# Patient Record
Sex: Female | Born: 1965 | Race: White | Hispanic: No | Marital: Married
Health system: Southern US, Community
[De-identification: ages and names within clinical notes are randomized; demographics above are authoritative.]

---

## 2016-01-19 DIAGNOSIS — E039 Hypothyroidism, unspecified: Secondary | ICD-10-CM | POA: Insufficient documentation

## 2016-01-19 DIAGNOSIS — F419 Anxiety disorder, unspecified: Secondary | ICD-10-CM | POA: Insufficient documentation

## 2016-01-19 DIAGNOSIS — H7291 Unspecified perforation of tympanic membrane, right ear: Secondary | ICD-10-CM | POA: Insufficient documentation

## 2016-01-19 DIAGNOSIS — J4520 Mild intermittent asthma, uncomplicated: Secondary | ICD-10-CM | POA: Insufficient documentation

## 2016-03-26 DIAGNOSIS — M25561 Pain in right knee: Secondary | ICD-10-CM | POA: Insufficient documentation

## 2016-03-26 DIAGNOSIS — I10 Essential (primary) hypertension: Secondary | ICD-10-CM | POA: Insufficient documentation

## 2019-10-09 ENCOUNTER — Other Ambulatory Visit: Payer: Self-pay

## 2019-10-09 ENCOUNTER — Other Ambulatory Visit: Payer: Self-pay | Admitting: Sports Medicine

## 2019-10-09 ENCOUNTER — Ambulatory Visit: Payer: BC Managed Care – PPO | Admitting: Sports Medicine

## 2019-10-09 ENCOUNTER — Encounter: Payer: Self-pay | Admitting: Sports Medicine

## 2019-10-09 DIAGNOSIS — S93401A Sprain of unspecified ligament of right ankle, initial encounter: Secondary | ICD-10-CM | POA: Diagnosis not present

## 2019-10-09 DIAGNOSIS — M778 Other enthesopathies, not elsewhere classified: Secondary | ICD-10-CM | POA: Diagnosis not present

## 2019-10-09 DIAGNOSIS — M79671 Pain in right foot: Secondary | ICD-10-CM

## 2019-10-09 DIAGNOSIS — M25571 Pain in right ankle and joints of right foot: Secondary | ICD-10-CM | POA: Diagnosis not present

## 2019-10-09 MED ORDER — TRIAMCINOLONE ACETONIDE 40 MG/ML IJ SUSP
20.0000 mg | Freq: Once | INTRAMUSCULAR | Status: AC
  Administered 2019-10-09: 20 mg

## 2019-10-09 NOTE — Progress Notes (Signed)
Subjective:  Alisha Perkins is a 54 y.o. female patient who presents to office for evaluation of right ankle pain. Patient complains of continued pain in the ankle at the lateral side and to the foot that started after sprain injury in march, went to a walk-in clinic for orthopedics and the doctor x-rayed her there and told her that nothing was broken and that she had arthritis, patient reports the pain is still present flares up at times pain is 8-10 out of 10 sharp in nature over the right lateral foot and ankle.  Patient reports that there is also swelling. Patient has tried icing that helps a little bit and Voltaren gel with no relief in symptoms. Patient denies any other pedal complaints. Denies recent re-injury/trip/fall/sprain/any other causative factors.  Admits to a previous history many years ago of bilateral ankle fractures and is wondering if her history of fractures and weakness caused her to have problems.  Review of systems noncontributory.  Patient Active Problem List   Diagnosis Date Noted  . Essential hypertension 03/26/2016  . Pain in right knee 03/26/2016  . Morbid obesity with BMI of 40.0-44.9, adult (HCC) 01/19/2016  . Anxiety disorder 01/19/2016  . Hypothyroidism 01/19/2016  . Intrinsic asthma without status asthmaticus 01/19/2016  . Perforation of right tympanic membrane 01/19/2016    Current Outpatient Medications on File Prior to Visit  Medication Sig Dispense Refill  . citalopram (CELEXA) 20 MG tablet citalopram 20 mg tablet  TAKE 1 TABLET BY MOUTH DAILY    . levothyroxine (SYNTHROID) 175 MCG tablet Synthroid 175 mcg tablet  TAKE 1 TABLET BY MOUTH EVERY DAY ON EMPTY STOMACH.    . lidocaine (XYLOCAINE) 1 % (with preservative) injection Xylocaine 10 mg/mL (1 %) injection solution  Take 2 mL by injection route.    . meloxicam (MOBIC) 15 MG tablet Take 15 mg by mouth daily.     No current facility-administered medications on file prior to visit.    Not on  File  Objective:  General: Alert and oriented x3 in no acute distress  Dermatology: No open lesions bilateral lower extremities, no webspace macerations, no ecchymosis bilateral, all nails x 10 are well manicured.  Vascular: Dorsalis Pedis and Posterior Tibial pedal pulses palpable, Capillary Fill Time 3 seconds,(+) pedal hair growth bilateral, no edema bilateral lower extremities, Temperature gradient within normal limits.  Neurology: Michaell Cowing sensation intact via light touch bilateral.  Musculoskeletal: Mild tenderness with palpation at right lateral ankle over the lateral ankle ligaments pain is worsened over the sinus tarsi as well as over the extensor digitorum brevis muscle belly on the right foot, negative talar tilt, mild instability. No pain with calf compression bilateral. Range of motion within normal limits with mild guarding on right ankle pain is worsened with plantarflexion and inversion of the right foot. Strength within normal limits in all groups bilateral with mild guarding on right.  Gait: Antalgic gait  Assessment and Plan: Problem List Items Addressed This Visit    None    Visit Diagnoses    Right ankle pain, unspecified chronicity    -  Primary   Right foot pain       Sprain of right ankle, unspecified ligament, initial encounter       Sinus tarsi syndrome of right ankle       Capsulitis of right foot           -Complete examination performed -Xrays were down this visit we will x-ray her next visit and associate with  this encounter since our x-ray machine was down and we had already put the order in -Discussed treatement options for likely residual sprain versus capsulitis/sinus tarsi tendinitis -After oral consent and aseptic prep, injected a mixture containing 1 ml of 2%  plain lidocaine, 1 ml 0.5% plain marcaine, 0.5 ml of kenalog 10 and 0.5 ml of dexamethasone phosphate into the right lateral foot and ankle at the sinus tarsi without complication.  Post-injection care discussed with patient.  -Dispensed cam boot for patient to use as instructed -Continue with rest ice elevation and meloxicam as needed for pain and inflammation advised patient if there is additional breakthrough pain should take Tylenol -Dispensed Surgigrip compression sleeve to use for additional support and edema control -Patient to return to office 2 to 3 weeks or sooner if condition worsens.  Asencion Islam, DPM

## 2019-10-23 ENCOUNTER — Other Ambulatory Visit: Payer: Self-pay | Admitting: Sports Medicine

## 2019-10-23 ENCOUNTER — Ambulatory Visit (INDEPENDENT_AMBULATORY_CARE_PROVIDER_SITE_OTHER): Payer: BC Managed Care – PPO

## 2019-10-23 ENCOUNTER — Encounter: Payer: Self-pay | Admitting: Sports Medicine

## 2019-10-23 ENCOUNTER — Other Ambulatory Visit: Payer: Self-pay

## 2019-10-23 ENCOUNTER — Ambulatory Visit (INDEPENDENT_AMBULATORY_CARE_PROVIDER_SITE_OTHER): Payer: BC Managed Care – PPO | Admitting: Sports Medicine

## 2019-10-23 DIAGNOSIS — M25571 Pain in right ankle and joints of right foot: Secondary | ICD-10-CM

## 2019-10-23 DIAGNOSIS — S93401D Sprain of unspecified ligament of right ankle, subsequent encounter: Secondary | ICD-10-CM

## 2019-10-23 DIAGNOSIS — S86311S Strain of muscle(s) and tendon(s) of peroneal muscle group at lower leg level, right leg, sequela: Secondary | ICD-10-CM

## 2019-10-23 DIAGNOSIS — M778 Other enthesopathies, not elsewhere classified: Secondary | ICD-10-CM

## 2019-10-23 DIAGNOSIS — M79671 Pain in right foot: Secondary | ICD-10-CM

## 2019-10-23 NOTE — Patient Instructions (Signed)

## 2019-10-23 NOTE — Progress Notes (Signed)
Subjective:  Alisha Perkins is a 54 y.o. female patient who presents to office for follow up evaluation of right ankle pain. Patient reports that she has pain the pain is about the same no worse 5 out of 10 states that after the injection there is less swelling and with use of the compression sleeve that it is helping however she is still having pain and cannot tolerate wearing the boot because it rubs her leg especially the fibula bone.  Patient reports that she has been taking Tylenol as needed for pain and denies any other constitutional symptoms at this time.  Patient Active Problem List   Diagnosis Date Noted   Essential hypertension 03/26/2016   Pain in right knee 03/26/2016   Morbid obesity with BMI of 40.0-44.9, adult (HCC) 01/19/2016   Anxiety disorder 01/19/2016   Hypothyroidism 01/19/2016   Intrinsic asthma without status asthmaticus 01/19/2016   Perforation of right tympanic membrane 01/19/2016    Current Outpatient Medications on File Prior to Visit  Medication Sig Dispense Refill   citalopram (CELEXA) 20 MG tablet citalopram 20 mg tablet  TAKE 1 TABLET BY MOUTH DAILY     levothyroxine (SYNTHROID) 175 MCG tablet Synthroid 175 mcg tablet  TAKE 1 TABLET BY MOUTH EVERY DAY ON EMPTY STOMACH.     lidocaine (XYLOCAINE) 1 % (with preservative) injection Xylocaine 10 mg/mL (1 %) injection solution  Take 2 mL by injection route.     meloxicam (MOBIC) 15 MG tablet Take 15 mg by mouth daily.     No current facility-administered medications on file prior to visit.    Not on File  Objective:  General: Alert and oriented x3 in no acute distress  Dermatology: No open lesions bilateral lower extremities, no webspace macerations, no ecchymosis bilateral, all nails x 10 are well manicured.  Vascular: Dorsalis Pedis and Posterior Tibial pedal pulses palpable, Capillary Fill Time 3 seconds,(+) pedal hair growth bilateral, no edema bilateral lower extremities, Temperature gradient  within normal limits.  Neurology: Michaell Cowing sensation intact via light touch bilateral.  Musculoskeletal: Mild to moderate tenderness with palpation at right lateral ankle over the lateral ankle ligaments pain is worsened over the peroneal tendon course as well as sinus tarsi as well as over the extensor digitorum brevis muscle belly on the right foot, negative talar tilt, mild instability. No pain with calf compression bilateral. Range of motion within normal limits with mild guarding on right ankle pain is worsened with plantarflexion and inversion of the right foot similar to last visit. Strength within normal limits in all groups bilateral with mild guarding on right.  X-rays show mild degenerative changes at ankle, no fracture or dislocation, no other acute findings.  Assessment and Plan: Problem List Items Addressed This Visit    None    Visit Diagnoses    Tear of peroneal tendon, right, sequela    -  Primary   Relevant Orders   MR ANKLE RIGHT WO CONTRAST   Right ankle pain, unspecified chronicity       Right foot pain       Sprain of right ankle, unspecified ligament, subsequent encounter       Sinus tarsi syndrome of right ankle       Capsulitis of right foot           -Complete examination performed -X-rays reviewed which show no acute pathology to support patient symptoms likely there is a high concern for ligamentous or tendon tear since patient symptoms have been going on  since March without any relief patient is unable to tolerate immobilization in cam boot and did not make any improvement with steroid injection rest ice elevation and previous treatment performed by walk-in clinic and orthopedics so at this time we will proceed with ordering the MRI right ankle to rule out tear -Advised patient that she can discontinue cam boot since it is rubbing and causing increase in pain -Dispensed Tri-Lock for patient to use for additional stabilization to see if this will offer her some  relief and to protect tendon and ligament until we get MRI to evaluate further for tear since she cannot tolerate cam boot -Advised good supportive shoes with brace; shoe list provided -Continue with Tylenol as needed for pain -Patient to return to office after MRI or sooner if condition worsens.  Asencion Islam, DPM

## 2019-10-30 ENCOUNTER — Telehealth: Payer: Self-pay

## 2019-10-30 ENCOUNTER — Other Ambulatory Visit: Payer: Self-pay

## 2019-10-30 ENCOUNTER — Other Ambulatory Visit: Payer: Self-pay | Admitting: Sports Medicine

## 2019-10-30 DIAGNOSIS — S86311S Strain of muscle(s) and tendon(s) of peroneal muscle group at lower leg level, right leg, sequela: Secondary | ICD-10-CM

## 2019-10-30 NOTE — Telephone Encounter (Signed)
-----   Message from Asencion Islam, [place] sent at 10/23/2019  9:31 PM EDT ----- Regarding: MRI right ankle I put an order for MRI of the right ankle to rule out tear of peroneal tendon and sent order to [facility].  Please follow-up this order to make sure they received it.  I think that [facility] imaging does a lot of the precerts for MRIs so we may not have to do anything else. -Dr. Kathie Rhodes

## 2019-10-30 NOTE — Telephone Encounter (Signed)
Faxed to [place] imagining order for authorization of MRI of Rt ankle to rule out tear of peroneal tendon.   I faxed pt's demographics, copy of insurance card, detailed office notes, ordering Dr's (NPI, tax Id, address, phone number and fax number), any diagnostic studies with results or therapy, current signs and symptoms of exam, and meds

## 2019-11-05 ENCOUNTER — Ambulatory Visit
Admission: RE | Admit: 2019-11-05 | Discharge: 2019-11-05 | Disposition: A | Discharge status: Home / Self Care | Payer: BC Managed Care – PPO | Source: Ambulatory Visit | Attending: Sports Medicine | Admitting: Sports Medicine

## 2019-11-05 DIAGNOSIS — S86311S Strain of muscle(s) and tendon(s) of peroneal muscle group at lower leg level, right leg, sequela: Secondary | ICD-10-CM

## 2019-11-07 ENCOUNTER — Other Ambulatory Visit: Payer: BC Managed Care – PPO

## 2019-11-09 ENCOUNTER — Other Ambulatory Visit: Payer: Self-pay | Admitting: Sports Medicine

## 2019-11-09 DIAGNOSIS — S86311S Strain of muscle(s) and tendon(s) of peroneal muscle group at lower leg level, right leg, sequela: Secondary | ICD-10-CM

## 2019-11-11 ENCOUNTER — Encounter: Payer: Self-pay | Admitting: Sports Medicine

## 2019-11-11 ENCOUNTER — Other Ambulatory Visit: Payer: BC Managed Care – PPO

## 2019-11-11 ENCOUNTER — Other Ambulatory Visit: Payer: Self-pay

## 2019-11-11 ENCOUNTER — Ambulatory Visit: Payer: BC Managed Care – PPO | Admitting: Sports Medicine

## 2019-11-11 DIAGNOSIS — S93401D Sprain of unspecified ligament of right ankle, subsequent encounter: Secondary | ICD-10-CM

## 2019-11-11 DIAGNOSIS — M25571 Pain in right ankle and joints of right foot: Secondary | ICD-10-CM | POA: Diagnosis not present

## 2019-11-11 DIAGNOSIS — S86311S Strain of muscle(s) and tendon(s) of peroneal muscle group at lower leg level, right leg, sequela: Secondary | ICD-10-CM

## 2019-11-11 DIAGNOSIS — M25371 Other instability, right ankle: Secondary | ICD-10-CM

## 2019-11-11 DIAGNOSIS — S92021K Displaced fracture of anterior process of right calcaneus, subsequent encounter for fracture with nonunion: Secondary | ICD-10-CM

## 2019-11-11 NOTE — Progress Notes (Signed)
Subjective: Quanesha Klimaszewski is a 54 y.o. female patient who returns office for follow-up evaluation of right ankle pain and for discussion of MRI results.  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  Patient denies any new problems since last encounter.  Patient Active Problem List   Diagnosis Date Noted  . Essential hypertension 03/26/2016  . Pain in right knee 03/26/2016  . Morbid obesity with BMI of 40.0-44.9, adult (HCC) 01/19/2016  . Anxiety disorder 01/19/2016  . Hypothyroidism 01/19/2016  . Intrinsic asthma without status asthmaticus 01/19/2016  . Perforation of right tympanic membrane 01/19/2016    Current Outpatient Medications on File Prior to Visit  Medication Sig Dispense Refill  . citalopram (CELEXA) 20 MG tablet citalopram 20 mg tablet  TAKE 1 TABLET BY MOUTH DAILY    . levothyroxine (SYNTHROID) 175 MCG tablet Synthroid 175 mcg tablet  TAKE 1 TABLET BY MOUTH EVERY DAY ON EMPTY STOMACH.    . lidocaine (XYLOCAINE) 1 % (with preservative) injection Xylocaine 10 mg/mL (1 %) injection solution  Take 2 mL by injection route.    . meloxicam (MOBIC) 15 MG tablet Take 15 mg by mouth daily.     No current facility-administered medications on file prior to visit.    Not on File  Objective:  General: Alert and oriented x3 in no acute distress  Dermatology: No open lesions bilateral lower extremities, no webspace macerations, no ecchymosis bilateral, all nails x 10 are well manicured.  Vascular: Dorsalis Pedis and Posterior Tibial pedal pulses palpable, Capillary Fill Time 3 seconds,(+) pedal hair growth bilateral, no edema bilateral lower extremities, Temperature gradient within normal limits.  Neurology: Michaell Cowing sensation intact via light touch  bilateral.  Musculoskeletal: Mild to moderate tenderness with palpation at right lateral ankle over the lateral ankle ligaments pain over the peroneal tendon course as well as sinus tarsi as well as over the extensor digitorum brevis muscle belly on the right foot that appears to be improving as compared to last encounter, negative talar tilt, mild instability. No pain with calf compression bilateral. Range of motion within normal limits with mild guarding on right ankle pain is worsened with plantarflexion and inversion of the right foot somewhat improved since last encounter.. Strength within normal limits in all groups bilateral with mild guarding on right.  MRI suggestive of partial tear of peroneal tendon as well as a nonunited fracture of the anterior process of the calcaneus on the right ankle/foot Assessment and Plan: Problem List Items Addressed This Visit    None    Visit Diagnoses    Tear of peroneal tendon, right, sequela    -  Primary   Right ankle pain, unspecified chronicity       Displaced fracture of anterior process of right calcaneus, subsequent encounter for fracture with nonunion       Sprain of right ankle, unspecified ligament, subsequent encounter       Ankle instability, right          -Complete examination performed -MRI results reviewed -Discussed with patient treatment options for partial peroneal tear and fracture fragment at the anterior process of the calcaneus -Patient wants to think about surgical options of a tendon repair and removal of the fracture fragment I  provided patient a copy of her MRI report as well as a copy of her authorization form for her to review patient will bring this form back with her next visit for [place] to further discuss the surgery and plan a time to do this; patient desires to talk this over with her husband and to 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  Asencion Islam, DPM

## 2019-12-11 ENCOUNTER — Other Ambulatory Visit: Payer: Self-pay

## 2019-12-11 ENCOUNTER — Ambulatory Visit (INDEPENDENT_AMBULATORY_CARE_PROVIDER_SITE_OTHER): Payer: BC Managed Care – PPO | Admitting: Sports Medicine

## 2019-12-11 ENCOUNTER — Encounter: Payer: Self-pay | Admitting: Sports Medicine

## 2019-12-11 DIAGNOSIS — S92021K Displaced fracture of anterior process of right calcaneus, subsequent encounter for fracture with nonunion: Secondary | ICD-10-CM | POA: Diagnosis not present

## 2019-12-11 DIAGNOSIS — S86311S Strain of muscle(s) and tendon(s) of peroneal muscle group at lower leg level, right leg, sequela: Secondary | ICD-10-CM

## 2019-12-11 DIAGNOSIS — M25571 Pain in right ankle and joints of right foot: Secondary | ICD-10-CM

## 2019-12-11 DIAGNOSIS — M79671 Pain in right foot: Secondary | ICD-10-CM | POA: Diagnosis not present

## 2019-12-11 NOTE — Progress Notes (Signed)
Subjective: Alisha Perkins is a 54 y.o. female patient who returns office for follow-up evaluation of right ankle pain and for discussion of surgery.  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  Patient reports that her ankle brace has helped and would like to buy a new one.  Patient denies any other issues at this time.  Patient Active Problem List   Diagnosis Date Noted  . Essential hypertension 03/26/2016  . Pain in right knee 03/26/2016  . Morbid obesity with BMI of 40.0-44.9, adult (HCC) 01/19/2016  . Anxiety disorder 01/19/2016  . Hypothyroidism 01/19/2016  . Intrinsic asthma without status asthmaticus 01/19/2016  . Perforation of right tympanic membrane 01/19/2016    Current Outpatient Medications on File Prior to Visit  Medication Sig Dispense Refill  . citalopram (CELEXA) 20 MG tablet citalopram 20 mg tablet  TAKE 1 TABLET BY MOUTH DAILY    . levothyroxine (SYNTHROID) 175 MCG tablet Synthroid 175 mcg tablet  TAKE 1 TABLET BY MOUTH EVERY DAY ON EMPTY STOMACH.    . lidocaine (XYLOCAINE) 1 % (with preservative) injection Xylocaine 10 mg/mL (1 %) injection solution  Take 2 mL by injection route.    . meloxicam (MOBIC) 15 MG tablet Take 15 mg by mouth daily.     No current facility-administered medications on file prior to visit.    Not on File  History reviewed. No pertinent family history.   Social History   Socioeconomic History  . Marital status: Married    Spouse name: Not on file  . Number of children: Not on file  . Years of education: Not on file  . Highest education level: Not on file  Occupational History  . Not on file  Tobacco Use  . Smoking status: Not on file  Substance and Sexual Activity  . Alcohol use: Not on file  . Drug use: Not on file  . Sexual activity: Not on file  Other Topics Concern  . Not on file   Social History Narrative  . Not on file   Social Determinants of Health   Financial Resource Strain:   . Difficulty of Paying Living Expenses: Not on file  Food Insecurity:   . Worried About Programme researcher, broadcasting/film/video in the Last Year: Not on file  . Ran Out of Food in the Last Year: Not on file  Transportation Needs:   . Lack of Transportation (Medical): Not on file  . Lack of Transportation (Non-Medical): Not on file  Physical Activity:   . Days of Exercise per Week: Not on file  . Minutes of Exercise per Session: Not on file  Stress:   . Feeling of Stress : Not on file  Social Connections:   . Frequency of Communication with Friends and Family: Not on file  . Frequency of Social Gatherings with Friends and Family: Not on file  . Attends Religious Services: Not on file  . Active Member of Clubs or Organizations: Not on file  . Attends Banker Meetings: Not on file  . Marital Status: Not on file    History reviewed. No pertinent surgical history.   Objective:  General: Alert and oriented x3 in no acute distress  Dermatology: No open lesions bilateral lower extremities, no webspace macerations, no ecchymosis bilateral, all nails x 10 are well manicured.  Vascular: Dorsalis Pedis  and Posterior Tibial pedal pulses palpable, Capillary Fill Time 3 seconds,(+) pedal hair growth bilateral, no edema bilateral lower extremities, Temperature gradient within normal limits.  Neurology: Michaell Cowing sensation intact via light touch bilateral.  Musculoskeletal: Mild to moderate tenderness with palpation at right lateral ankle over the lateral ankle ligaments pain over the peroneal tendon course as well as sinus tarsi as well as over the extensor digitorum brevis muscle belly on the right foot that appears to be improving, negative talar tilt, mild instability. No pain with calf compression bilateral. Range of motion within normal limits with mild guarding on right ankle pain is worsened  with plantarflexion and inversion of the right foot, improving in nature.  Strength within normal limits in all groups bilateral with mild guarding on right.  Assessment and Plan: Problem List Items Addressed This Visit    None    Visit Diagnoses    Tear of peroneal tendon, right, sequela    -  Primary   Displaced fracture of anterior process of right calcaneus, subsequent encounter for fracture with nonunion       Right ankle pain, unspecified chronicity       Right foot pain          -Complete examination performed -Discussed again with patient treatment options for partial peroneal tear and fracture fragment at the anterior process of the calcaneus -Patient opt for surgical management. Consent obtained for excision of fracture fragment at right anterior calcaneus as well as repair right ankle.  Pre and Post op course explained. Risks, benefits, alternatives explained. No guarantees given or implied. Surgical booking slip submitted and provided patient with Surgical packet and info for [facility] specialty surgical center. -Patient will borrow knee scooter from friend to remain nonweightbearing for 1 month after surgery -Meanwhile continue with Tri-Lock for patient to use for additional stabilization until time for surgery -Advised may continue with Tylenol as needed for pain like previous -Patient to return to office for after surgery or sooner if condition worsens.  Asencion Islam, DPM

## 2019-12-25 ENCOUNTER — Ambulatory Visit: Payer: BC Managed Care – PPO | Admitting: Sports Medicine

## 2020-02-01 ENCOUNTER — Telehealth: Payer: Self-pay

## 2020-02-01 NOTE — Telephone Encounter (Signed)
DOS 02/15/2020  REPAIR TENDON RT - 28086 REMOVAL OF FX FRAGMENTS RT - 28415  [employer] ST EFFECTIVE DATE - 03/13/2019  PLAN DEDUCTIBLE - $1500.00 W/ $286.38 REMAINING OUT OF POCKET - $5900.00 W/ $1771.16 REMAINING COPAY $0.00 COINSURANCE - 30% NO AUTH REQUIRED PER WEBSITE

## 2020-02-13 ENCOUNTER — Other Ambulatory Visit: Payer: Self-pay | Admitting: Sports Medicine

## 2020-02-13 DIAGNOSIS — Z9889 Other specified postprocedural states: Secondary | ICD-10-CM

## 2020-02-13 NOTE — Progress Notes (Signed)
Post op meds entered 

## 2020-02-15 ENCOUNTER — Encounter: Payer: Self-pay | Admitting: Sports Medicine

## 2020-02-15 DIAGNOSIS — S92021K Displaced fracture of anterior process of right calcaneus, subsequent encounter for fracture with nonunion: Secondary | ICD-10-CM

## 2020-02-15 DIAGNOSIS — S86311S Strain of muscle(s) and tendon(s) of peroneal muscle group at lower leg level, right leg, sequela: Secondary | ICD-10-CM

## 2020-02-15 MED ORDER — DOCUSATE SODIUM 100 MG PO CAPS
100.0000 mg | ORAL_CAPSULE | Freq: Two times a day (BID) | ORAL | 0 refills | Status: DC
Start: 2020-02-15 — End: 2020-03-02

## 2020-02-15 MED ORDER — HYDROCODONE-ACETAMINOPHEN 10-325 MG PO TABS: 1.0000 | ORAL_TABLET | Freq: Four times a day (QID) | ORAL | 0 refills | Status: AC | PRN

## 2020-02-15 MED ORDER — PROMETHAZINE HCL 25 MG PO TABS: 25.0000 mg | ORAL_TABLET | Freq: Three times a day (TID) | ORAL | 0 refills | Status: DC | PRN

## 2020-02-15 MED ORDER — IBUPROFEN 800 MG PO TABS: 800.0000 mg | ORAL_TABLET | Freq: Three times a day (TID) | ORAL | 0 refills | Status: DC | PRN

## 2020-02-22 ENCOUNTER — Other Ambulatory Visit: Payer: Self-pay | Admitting: Sports Medicine

## 2020-02-22 NOTE — Telephone Encounter (Signed)
Please advise 

## 2020-02-23 ENCOUNTER — Ambulatory Visit (INDEPENDENT_AMBULATORY_CARE_PROVIDER_SITE_OTHER): Payer: BC Managed Care – PPO

## 2020-02-23 ENCOUNTER — Encounter: Payer: Self-pay | Admitting: Sports Medicine

## 2020-02-23 ENCOUNTER — Ambulatory Visit (INDEPENDENT_AMBULATORY_CARE_PROVIDER_SITE_OTHER): Payer: BC Managed Care – PPO | Admitting: Sports Medicine

## 2020-02-23 ENCOUNTER — Other Ambulatory Visit: Payer: Self-pay

## 2020-02-23 DIAGNOSIS — Z9889 Other specified postprocedural states: Secondary | ICD-10-CM

## 2020-02-23 DIAGNOSIS — S86311S Strain of muscle(s) and tendon(s) of peroneal muscle group at lower leg level, right leg, sequela: Secondary | ICD-10-CM | POA: Diagnosis not present

## 2020-02-23 DIAGNOSIS — S92021K Displaced fracture of anterior process of right calcaneus, subsequent encounter for fracture with nonunion: Secondary | ICD-10-CM

## 2020-02-23 DIAGNOSIS — M79671 Pain in right foot: Secondary | ICD-10-CM

## 2020-02-23 DIAGNOSIS — M25571 Pain in right ankle and joints of right foot: Secondary | ICD-10-CM

## 2020-02-23 MED ORDER — HYDROCODONE-ACETAMINOPHEN 10-325 MG PO TABS: 1.0000 | ORAL_TABLET | Freq: Four times a day (QID) | ORAL | 0 refills | Status: AC | PRN

## 2020-02-23 NOTE — Progress Notes (Signed)
Subjective: Alisha Perkins is a 54 y.o. female patient seen today in office for POV #1 (DOS 02/15/20), S/P excision of fracture fragment and repair of peroneal tendon on right. Patient admits some pain at surgical site feels a pulling sensation, denies calf pain, denies headache, chest pain, shortness of breath, nausea, vomiting, fever, or chills. No other issues noted.   Patient Active Problem List   Diagnosis Date Noted  . Essential hypertension 03/26/2016  . Pain in right knee 03/26/2016  . Morbid obesity with BMI of 40.0-44.9, adult (HCC) 01/19/2016  . Anxiety disorder 01/19/2016  . Hypothyroidism 01/19/2016  . Intrinsic asthma without status asthmaticus 01/19/2016  . Perforation of right tympanic membrane 01/19/2016    Current Outpatient Medications on File Prior to Visit  Medication Sig Dispense Refill  . cephALEXin (KEFLEX) 500 MG capsule SMARTSIG:4 Capsule(s) By Mouth Once    . citalopram (CELEXA) 20 MG tablet citalopram 20 mg tablet  TAKE 1 TABLET BY MOUTH DAILY    . docusate sodium (COLACE) 100 MG capsule Take 1 capsule (100 mg total) by mouth 2 (two) times daily. 10 capsule 0  . doxycycline (VIBRAMYCIN) 100 MG capsule Take 100 mg by mouth 2 (two) times daily.    Marland Kitchen ibuprofen (ADVIL) 800 MG tablet TAKE 1 TABLET(800 MG) BY MOUTH EVERY 8 HOURS AS NEEDED 30 tablet 0  . levothyroxine (SYNTHROID) 137 MCG tablet Take 137 mcg by mouth daily.    Marland Kitchen levothyroxine (SYNTHROID) 175 MCG tablet Synthroid 175 mcg tablet  TAKE 1 TABLET BY MOUTH EVERY DAY ON EMPTY STOMACH.    . lidocaine (XYLOCAINE) 1 % (with preservative) injection Xylocaine 10 mg/mL (1 %) injection solution  Take 2 mL by injection route.    . meloxicam (MOBIC) 15 MG tablet Take 15 mg by mouth daily.    . predniSONE (DELTASONE) 20 MG tablet Take 20 mg by mouth 2 (two) times daily.    . promethazine (PHENERGAN) 25 MG tablet Take 1 tablet (25 mg total) by mouth every 8 (eight) hours as needed for nausea or vomiting. 20 tablet 0  .  VIRTUSSIN A/C 100-10 MG/5ML syrup Take 10 mLs by mouth every 4 (four) hours as needed.     No current facility-administered medications on file prior to visit.    Not on File  Objective: There were no vitals filed for this visit.  General: No acute distress, AAOx3  Right foot: Sutures and staples intact with no gapping or dehiscence at surgical site, mild swelling to right foot, no erythema, no warmth, no drainage, no signs of infection noted, Capillary fill time <3 seconds in all digits, gross sensation present via light touch to right foot.Guarding with range of motion right foot.  No pain with calf compression.   Post Op Xray, Right foot: Fracture fragment excised at anterior calcaneus. Soft tissue swelling within normal limits for post op status.   Assessment and Plan:  Problem List Items Addressed This Visit   None   Visit Diagnoses    Tear of peroneal tendon, right, sequela    -  Primary   Relevant Orders   DG Foot Complete Right   S/P foot surgery, right       Displaced fracture of anterior process of right calcaneus, subsequent encounter for fracture with nonunion       Right ankle pain, unspecified chronicity       Right foot pain           -Patient seen and evaluated -Xrays reviewed consistent  with post op status  -Applied unna boot and dry sterile dressing to surgical site right foot secured with ACE wrap and stockinet  -Advised patient to make sure to keep dressings clean, dry, and intact to right surgical site, removing the ACE as needed  -Advised patient to continue with nonweightbearing and use of knee scooter ASA for DVT PPX -Advised patient to limit activity to necessity  -Advised patient to ice and elevate as instructed -Refilled Norco -Will plan for possible suture removal at next office visit. In the meantime, patient to call office if any issues or problems arise.   Asencion Islam, DPM

## 2020-03-01 ENCOUNTER — Telehealth: Payer: Self-pay | Admitting: *Deleted

## 2020-03-01 ENCOUNTER — Encounter: Payer: BC Managed Care – PPO | Admitting: Sports Medicine

## 2020-03-01 NOTE — Telephone Encounter (Signed)
-----   Message from Asencion Islam, [place] sent at 02/26/2020  5:04 PM EST ----- Regarding: Denial for pain medication Will you let her know that her pain medication was denied for coverage from the insurance. She can pay for the Rx from the pharmacy if she still need the Norco  -Dr. Marylene Land

## 2020-03-01 NOTE — Telephone Encounter (Signed)
Called and spoke with the patient about the pain medicine which was denied by the insurance and patient stated that she was doing better and does not need the pain medicine now. Alisha Perkins

## 2020-03-02 ENCOUNTER — Encounter: Payer: Self-pay | Admitting: Sports Medicine

## 2020-03-02 ENCOUNTER — Other Ambulatory Visit: Payer: Self-pay

## 2020-03-02 ENCOUNTER — Other Ambulatory Visit: Payer: Self-pay | Admitting: Sports Medicine

## 2020-03-02 ENCOUNTER — Ambulatory Visit (INDEPENDENT_AMBULATORY_CARE_PROVIDER_SITE_OTHER): Payer: BC Managed Care – PPO | Admitting: Sports Medicine

## 2020-03-02 DIAGNOSIS — Z9889 Other specified postprocedural states: Secondary | ICD-10-CM

## 2020-03-02 DIAGNOSIS — S86311S Strain of muscle(s) and tendon(s) of peroneal muscle group at lower leg level, right leg, sequela: Secondary | ICD-10-CM

## 2020-03-02 DIAGNOSIS — M25571 Pain in right ankle and joints of right foot: Secondary | ICD-10-CM

## 2020-03-02 DIAGNOSIS — M79671 Pain in right foot: Secondary | ICD-10-CM

## 2020-03-02 DIAGNOSIS — S92021K Displaced fracture of anterior process of right calcaneus, subsequent encounter for fracture with nonunion: Secondary | ICD-10-CM

## 2020-03-02 NOTE — Progress Notes (Signed)
Subjective: Alisha Perkins is a 54 y.o. female patient seen today in office for POV # 2 (DOS 02/15/20), S/P excision of fracture fragment and repair of peroneal tendon on right. Patient reports that she is doing better has some occasional pains currently taking ibuprofen, denies calf pain, denies headache, chest pain, shortness of breath, nausea, vomiting, fever, or chills. No other issues noted.   Patient Active Problem List   Diagnosis Date Noted  . Essential hypertension 03/26/2016  . Pain in right knee 03/26/2016  . Morbid obesity with BMI of 40.0-44.9, adult (HCC) 01/19/2016  . Anxiety disorder 01/19/2016  . Hypothyroidism 01/19/2016  . Intrinsic asthma without status asthmaticus 01/19/2016  . Perforation of right tympanic membrane 01/19/2016    Current Outpatient Medications on File Prior to Visit  Medication Sig Dispense Refill  . citalopram (CELEXA) 20 MG tablet Take 20 mg by mouth daily.    Marland Kitchen docusate sodium (COLACE) 100 MG capsule Take 100 mg by mouth 2 (two) times daily.    Marland Kitchen ibuprofen (ADVIL) 800 MG tablet Take 800 mg by mouth every 8 (eight) hours as needed.    Marland Kitchen levothyroxine (SYNTHROID) 137 MCG tablet Take 137 mcg by mouth daily before breakfast.    . meloxicam (MOBIC) 15 MG tablet Take 15 mg by mouth daily.    . predniSONE (DELTASONE) 20 MG tablet Take 20 mg by mouth daily with breakfast.    . promethazine (PHENERGAN) 25 MG tablet Take 25 mg by mouth every 6 (six) hours as needed for nausea or vomiting.     No current facility-administered medications on file prior to visit.    Not on File  Objective: There were no vitals filed for this visit.  General: No acute distress, AAOx3  Right foot: Sutures and staples intact with no gapping or dehiscence at surgical site, reduced swelling to right foot, no erythema, no warmth, no drainage, no signs of infection noted, Capillary fill time <3 seconds in all digits, gross sensation present via light touch to right foot.Guarding with  range of motion right foot.  No pain with calf compression.     Assessment and Plan:  Problem List Items Addressed This Visit   None   Visit Diagnoses    Tear of peroneal tendon, right, sequela    -  Primary   S/P foot surgery, right       Displaced fracture of anterior process of right calcaneus, subsequent encounter for fracture with nonunion       Right ankle pain, unspecified chronicity       Right foot pain           -Patient seen and evaluated -Sutures removed but staples were left intact -Applied antibiotic cream and dry sterile dressing to surgical site right foot secured with ACE wrap and stockinet  -Advised patient may have daughter to assist with changing dry dressings on next week -Advised patient to continue with nonweightbearing and use of knee scooter ASA for DVT PPX -Advised patient to limit activity to necessity  -Advised patient to ice and elevate as instructed -Continue with Motrin as needed for pain -Will plan for removal of staples, x-ray, and to allow weightbearing with cam boot at next office visit. In the meantime, patient to call office if any issues or problems arise.   Asencion Islam, DPM

## 2020-03-02 NOTE — Telephone Encounter (Signed)
Please advise 

## 2020-03-09 ENCOUNTER — Other Ambulatory Visit: Payer: Self-pay | Admitting: Sports Medicine

## 2020-03-15 ENCOUNTER — Encounter: Payer: BC Managed Care – PPO | Admitting: Sports Medicine

## 2020-03-16 ENCOUNTER — Encounter: Payer: Self-pay | Admitting: Sports Medicine

## 2020-03-16 ENCOUNTER — Other Ambulatory Visit: Payer: Self-pay

## 2020-03-16 ENCOUNTER — Ambulatory Visit (INDEPENDENT_AMBULATORY_CARE_PROVIDER_SITE_OTHER): Payer: BC Managed Care – PPO | Admitting: Sports Medicine

## 2020-03-16 ENCOUNTER — Ambulatory Visit (INDEPENDENT_AMBULATORY_CARE_PROVIDER_SITE_OTHER): Payer: Self-pay

## 2020-03-16 DIAGNOSIS — M25571 Pain in right ankle and joints of right foot: Secondary | ICD-10-CM

## 2020-03-16 DIAGNOSIS — S86311S Strain of muscle(s) and tendon(s) of peroneal muscle group at lower leg level, right leg, sequela: Secondary | ICD-10-CM

## 2020-03-16 DIAGNOSIS — Z9889 Other specified postprocedural states: Secondary | ICD-10-CM

## 2020-03-16 DIAGNOSIS — S92021K Displaced fracture of anterior process of right calcaneus, subsequent encounter for fracture with nonunion: Secondary | ICD-10-CM

## 2020-03-16 DIAGNOSIS — M79671 Pain in right foot: Secondary | ICD-10-CM

## 2020-03-16 NOTE — Progress Notes (Signed)
Subjective: Alisha Perkins is a 55 y.o. female patient seen today in office for POV # 3 (DOS 02/15/20), S/P excision of fracture fragment and repair of peroneal tendon on right. Patient reports that she is doing better has some pain and is taking ibuprofen, denies calf pain, denies headache, chest pain, shortness of breath, nausea, vomiting, fever, or chills. No other issues noted.   Patient Active Problem List   Diagnosis Date Noted  . Essential hypertension 03/26/2016  . Pain in right knee 03/26/2016  . Morbid obesity with BMI of 40.0-44.9, adult (HCC) 01/19/2016  . Anxiety disorder 01/19/2016  . Hypothyroidism 01/19/2016  . Intrinsic asthma without status asthmaticus 01/19/2016  . Perforation of right tympanic membrane 01/19/2016    Current Outpatient Medications on File Prior to Visit  Medication Sig Dispense Refill  . citalopram (CELEXA) 20 MG tablet Take 20 mg by mouth daily.    Marland Kitchen docusate sodium (COLACE) 100 MG capsule Take 100 mg by mouth 2 (two) times daily.    Marland Kitchen ibuprofen (ADVIL) 800 MG tablet Take 800 mg by mouth every 8 (eight) hours as needed.    Marland Kitchen ibuprofen (ADVIL) 800 MG tablet TAKE 1 TABLET(800 MG) BY MOUTH EVERY 8 HOURS AS NEEDED 30 tablet 0  . levothyroxine (SYNTHROID) 137 MCG tablet Take 137 mcg by mouth daily before breakfast.    . meloxicam (MOBIC) 15 MG tablet Take 15 mg by mouth daily.    . predniSONE (DELTASONE) 20 MG tablet Take 20 mg by mouth daily with breakfast.    . promethazine (PHENERGAN) 25 MG tablet Take 25 mg by mouth every 6 (six) hours as needed for nausea or vomiting.     No current facility-administered medications on file prior to visit.    Not on File  Objective: There were no vitals filed for this visit.  General: No acute distress, AAOx3  Right foot: Staples intact with no gapping or dehiscence at surgical site, reduced swelling to right foot, no erythema, no warmth, no drainage, no signs of infection noted, Capillary fill time <3 seconds in all  digits, gross sensation present via light touch to right foot. Mild guarding with range of motion right foot.  No pain with calf compression.    Xrays consistent with post op status  Assessment and Plan:  Problem List Items Addressed This Visit   None   Visit Diagnoses    Tear of peroneal tendon, right, sequela    -  Primary   Relevant Orders   DG Ankle Complete Right (Completed)   S/P foot surgery, right       Displaced fracture of anterior process of right calcaneus, subsequent encounter for fracture with nonunion       Right ankle pain, unspecified chronicity       Right foot pain           -Patient seen and evaluated -X-rays reviewed -Staples were removed -Applied antibiotic cream and dry sterile dressing to surgical site right foot secured with ACE wrap and stockinet  -Patient may slowly begin to weight-bear with cam boot as dispensed on today's visit advised patient may use scooter for long distance or walker or cane -Advised patient to ice and elevate as instructed -Continue with Motrin as needed for pain -Will plan for PT and slowly increasing activities at next office visit. In the meantime, patient to call office if any issues or problems arise.   Asencion Islam, DPM

## 2020-03-18 ENCOUNTER — Ambulatory Visit: Payer: Self-pay | Admitting: Sports Medicine

## 2020-03-18 ENCOUNTER — Telehealth: Payer: Self-pay | Admitting: *Deleted

## 2020-03-18 NOTE — Telephone Encounter (Signed)
Patient called and stated that the steri stripes were coming off on the back of the right heel from when Dr Marylene Land put them on when patient was in the office the other day (patient had surgery)  and I stated to the patient that Dr Marylene Land stated that the steri stripes would peel off slowly and that was normal and they are suppose to do that and I stated to the patient to take it easy and use the knee scooter and can put a little bit of ointment on it as well and patient stated that she got a call from our office Chad Cordial) saying that she was to come in and have it checked and I assured her that I spoke with Dr Marylene Land and that this  was normal and if any concerns or questions to call the [place] office. Misty Stanley

## 2020-04-08 ENCOUNTER — Encounter: Payer: Self-pay | Admitting: Sports Medicine

## 2020-04-08 ENCOUNTER — Other Ambulatory Visit: Payer: Self-pay

## 2020-04-08 ENCOUNTER — Ambulatory Visit (INDEPENDENT_AMBULATORY_CARE_PROVIDER_SITE_OTHER): Payer: BC Managed Care – PPO | Admitting: Sports Medicine

## 2020-04-08 DIAGNOSIS — Z9889 Other specified postprocedural states: Secondary | ICD-10-CM

## 2020-04-08 DIAGNOSIS — M79671 Pain in right foot: Secondary | ICD-10-CM

## 2020-04-08 DIAGNOSIS — M25571 Pain in right ankle and joints of right foot: Secondary | ICD-10-CM

## 2020-04-08 DIAGNOSIS — S86311S Strain of muscle(s) and tendon(s) of peroneal muscle group at lower leg level, right leg, sequela: Secondary | ICD-10-CM

## 2020-04-08 DIAGNOSIS — S92021K Displaced fracture of anterior process of right calcaneus, subsequent encounter for fracture with nonunion: Secondary | ICD-10-CM

## 2020-04-08 NOTE — Progress Notes (Signed)
Subjective: Alisha Perkins is a 55 y.o. female patient seen today in office for POV # 4 (DOS 02/15/20), S/P excision of fracture fragment and repair of peroneal tendon on right. Patient reports that she is doing good but did have some rubbing in the cam boot and use a pad to protect her ankle.  Patient reports that she also has some swelling but is not icing like she should be and using the Ace wrap which has helped a lot with the swelling.  Patient denies calf pain, denies headache, chest pain, shortness of breath, nausea, vomiting, fever, or chills. No other issues noted.   Patient Active Problem List   Diagnosis Date Noted  . Essential hypertension 03/26/2016  . Pain in right knee 03/26/2016  . Morbid obesity with BMI of 40.0-44.9, adult (HCC) 01/19/2016  . Anxiety disorder 01/19/2016  . Hypothyroidism 01/19/2016  . Intrinsic asthma without status asthmaticus 01/19/2016  . Perforation of right tympanic membrane 01/19/2016    Current Outpatient Medications on File Prior to Visit  Medication Sig Dispense Refill  . citalopram (CELEXA) 20 MG tablet Take 20 mg by mouth daily.    Marland Kitchen docusate sodium (COLACE) 100 MG capsule Take 100 mg by mouth 2 (two) times daily.    Marland Kitchen ibuprofen (ADVIL) 800 MG tablet Take 800 mg by mouth every 8 (eight) hours as needed.    Marland Kitchen ibuprofen (ADVIL) 800 MG tablet TAKE 1 TABLET(800 MG) BY MOUTH EVERY 8 HOURS AS NEEDED 30 tablet 0  . levothyroxine (SYNTHROID) 137 MCG tablet Take 137 mcg by mouth daily before breakfast.    . meloxicam (MOBIC) 15 MG tablet Take 15 mg by mouth daily.    . predniSONE (DELTASONE) 20 MG tablet Take 20 mg by mouth daily with breakfast.    . promethazine (PHENERGAN) 25 MG tablet Take 25 mg by mouth every 6 (six) hours as needed for nausea or vomiting.     No current facility-administered medications on file prior to visit.    Not on File  Objective: There were no vitals filed for this visit.  General: No acute distress, AAOx3  Right foot:  Incision well-healed right lateral foot and ankle, mild swelling, no erythema, no warmth, no drainage, no signs of infection noted, Capillary fill time <3 seconds in all digits, gross sensation present via light touch to right foot.  Range of motion within normal limits without major limitation.  No pain with calf compression.     Assessment and Plan:  Problem List Items Addressed This Visit   None   Visit Diagnoses    S/P foot surgery, right    -  Primary   Tear of peroneal tendon, right, sequela       Displaced fracture of anterior process of right calcaneus, subsequent encounter for fracture with nonunion       Right ankle pain, unspecified chronicity       Right foot pain           -Patient seen and evaluated -Incision well-healed -Dispensed surgical shoe for patient to weight-bear as instructed -Dispensed Surgigrip compression sleeve to assist with edema control -Advised patient next visit may consider transitioning her to tennis shoe with brace if she is doing well or adding on PT -Advised patient to ice and elevate as instructed -Continue with Motrin as needed for pain -Will plan for post op check at next office visit. In the meantime, patient to call office if any issues or problems arise.   Asencion Islam, DPM

## 2020-04-29 ENCOUNTER — Encounter: Payer: Self-pay | Admitting: Sports Medicine

## 2020-04-29 ENCOUNTER — Other Ambulatory Visit: Payer: Self-pay

## 2020-04-29 ENCOUNTER — Ambulatory Visit (INDEPENDENT_AMBULATORY_CARE_PROVIDER_SITE_OTHER): Payer: BC Managed Care – PPO | Admitting: Sports Medicine

## 2020-04-29 DIAGNOSIS — M79671 Pain in right foot: Secondary | ICD-10-CM

## 2020-04-29 DIAGNOSIS — M25571 Pain in right ankle and joints of right foot: Secondary | ICD-10-CM

## 2020-04-29 DIAGNOSIS — S86311S Strain of muscle(s) and tendon(s) of peroneal muscle group at lower leg level, right leg, sequela: Secondary | ICD-10-CM

## 2020-04-29 DIAGNOSIS — S92021K Displaced fracture of anterior process of right calcaneus, subsequent encounter for fracture with nonunion: Secondary | ICD-10-CM

## 2020-04-29 DIAGNOSIS — Z9889 Other specified postprocedural states: Secondary | ICD-10-CM

## 2020-04-29 NOTE — Patient Instructions (Signed)

## 2020-04-29 NOTE — Progress Notes (Signed)
Subjective: Alisha Perkins is a 55 y.o. female patient seen today in office for POV # 5 (DOS 02/15/20), S/P excision of fracture fragment and repair of peroneal tendon on right. Patient reports that she is doing ok with a little swelling.  Patient denies calf pain, denies headache, chest pain, shortness of breath, nausea, vomiting, fever, or chills. No other issues noted.   Patient Active Problem List   Diagnosis Date Noted  . Essential hypertension 03/26/2016  . Pain in right knee 03/26/2016  . Morbid obesity with BMI of 40.0-44.9, adult (HCC) 01/19/2016  . Anxiety disorder 01/19/2016  . Hypothyroidism 01/19/2016  . Intrinsic asthma without status asthmaticus 01/19/2016  . Perforation of right tympanic membrane 01/19/2016    Current Outpatient Medications on File Prior to Visit  Medication Sig Dispense Refill  . azithromycin (ZITHROMAX) 250 MG tablet Take by mouth.    . citalopram (CELEXA) 20 MG tablet Take 20 mg by mouth daily.    Marland Kitchen docusate sodium (COLACE) 100 MG capsule Take 100 mg by mouth 2 (two) times daily.    . fluconazole (DIFLUCAN) 150 MG tablet TAKE ONE TABLET BY MOUTH ONCE    . ibuprofen (ADVIL) 800 MG tablet Take 800 mg by mouth every 8 (eight) hours as needed.    Marland Kitchen ibuprofen (ADVIL) 800 MG tablet TAKE 1 TABLET(800 MG) BY MOUTH EVERY 8 HOURS AS NEEDED 30 tablet 0  . levothyroxine (SYNTHROID) 137 MCG tablet Take 137 mcg by mouth daily before breakfast.    . meloxicam (MOBIC) 15 MG tablet Take 15 mg by mouth daily.    . predniSONE (DELTASONE) 20 MG tablet Take 20 mg by mouth daily with breakfast.    . promethazine (PHENERGAN) 25 MG tablet Take 25 mg by mouth every 6 (six) hours as needed for nausea or vomiting.     No current facility-administered medications on file prior to visit.    No Known Allergies  Objective: There were no vitals filed for this visit.  General: No acute distress, AAOx3  Right foot: Incision well-healed right lateral foot and ankle, mild swelling, no  erythema, no warmth, no drainage, no signs of infection noted, Capillary fill time <3 seconds in all digits, gross sensation present via light touch to right foot.  Range of motion within normal limits without major limitation.  No pain with calf compression.   Assessment and Plan:  Problem List Items Addressed This Visit   None   Visit Diagnoses    S/P foot surgery, right    -  Primary   Tear of peroneal tendon, right, sequela       Displaced fracture of anterior process of right calcaneus, subsequent encounter for fracture with nonunion       Right ankle pain, unspecified chronicity       Right foot pain           -Patient seen and evaluated -May wean from surgical shoe to tennis shoe with brace -Dispensed Surgical compression sleeve to assist with edema control -Advised patient to continue to ice and elevate as instructed -Encouraged scar creams and gentle ROM -Continue with Motrin as needed for pain -Will plan for xrays and post op check at next office visit. In the meantime, patient to call office if any issues or problems arise. May add on PT if patient needs it.   Asencion Islam, DPM

## 2020-05-24 ENCOUNTER — Ambulatory Visit: Payer: BC Managed Care – PPO | Admitting: Sports Medicine

## 2020-05-24 ENCOUNTER — Encounter: Payer: Self-pay | Admitting: Sports Medicine

## 2020-05-24 ENCOUNTER — Other Ambulatory Visit: Payer: Self-pay

## 2020-05-24 ENCOUNTER — Ambulatory Visit (INDEPENDENT_AMBULATORY_CARE_PROVIDER_SITE_OTHER): Payer: BC Managed Care – PPO

## 2020-05-24 DIAGNOSIS — S92021K Displaced fracture of anterior process of right calcaneus, subsequent encounter for fracture with nonunion: Secondary | ICD-10-CM

## 2020-05-24 DIAGNOSIS — Z9889 Other specified postprocedural states: Secondary | ICD-10-CM

## 2020-05-24 DIAGNOSIS — M25571 Pain in right ankle and joints of right foot: Secondary | ICD-10-CM

## 2020-05-24 DIAGNOSIS — S86311S Strain of muscle(s) and tendon(s) of peroneal muscle group at lower leg level, right leg, sequela: Secondary | ICD-10-CM

## 2020-05-24 DIAGNOSIS — M79671 Pain in right foot: Secondary | ICD-10-CM

## 2020-05-24 NOTE — Progress Notes (Signed)
Subjective: Alisha Perkins is a 55 y.o. female patient seen today in office for POV # 6 (DOS 02/15/20), S/P excision of fracture fragment and repair of peroneal tendon on right. Patient reports that she is doing good, has no pain but incision still feels funny, Reports that she got new shoes and her feet feel good, no other issues noted.   Patient Active Problem List   Diagnosis Date Noted  . Essential hypertension 03/26/2016  . Pain in right knee 03/26/2016  . Morbid obesity with BMI of 40.0-44.9, adult (HCC) 01/19/2016  . Anxiety disorder 01/19/2016  . Hypothyroidism 01/19/2016  . Intrinsic asthma without status asthmaticus 01/19/2016  . Perforation of right tympanic membrane 01/19/2016    Current Outpatient Medications on File Prior to Visit  Medication Sig Dispense Refill  . azithromycin (ZITHROMAX) 250 MG tablet Take by mouth.    . citalopram (CELEXA) 20 MG tablet Take 20 mg by mouth daily.    Marland Kitchen docusate sodium (COLACE) 100 MG capsule Take 100 mg by mouth 2 (two) times daily.    . fluconazole (DIFLUCAN) 150 MG tablet TAKE ONE TABLET BY MOUTH ONCE    . ibuprofen (ADVIL) 800 MG tablet Take 800 mg by mouth every 8 (eight) hours as needed.    Marland Kitchen ibuprofen (ADVIL) 800 MG tablet TAKE 1 TABLET(800 MG) BY MOUTH EVERY 8 HOURS AS NEEDED 30 tablet 0  . levothyroxine (SYNTHROID) 137 MCG tablet Take 137 mcg by mouth daily before breakfast.    . meloxicam (MOBIC) 15 MG tablet Take 15 mg by mouth daily.    . predniSONE (DELTASONE) 20 MG tablet Take 20 mg by mouth daily with breakfast.    . promethazine (PHENERGAN) 25 MG tablet Take 25 mg by mouth every 6 (six) hours as needed for nausea or vomiting.     No current facility-administered medications on file prior to visit.    No Known Allergies  Objective: There were no vitals filed for this visit.  General: No acute distress, AAOx3  Right foot: Incision well-healed right lateral foot and ankle, mild swelling, no erythema, no warmth, no drainage, no  signs of infection noted, Capillary fill time <3 seconds in all digits, gross sensation present via light touch to right foot.  Range of motion within normal limits without major limitation.  Strength 5/5 in all groups. No pain with calf compression.   Xrays no acute osseous findings   Assessment and Plan:  Problem List Items Addressed This Visit   None   Visit Diagnoses    S/P foot surgery, right    -  Primary   Relevant Orders   DG Foot Complete Right (Completed)   Tear of peroneal tendon, right, sequela       Relevant Orders   DG Foot Complete Right (Completed)   Displaced fracture of anterior process of right calcaneus, subsequent encounter for fracture with nonunion       Relevant Orders   DG Foot Complete Right (Completed)   Right ankle pain, unspecified chronicity       Right foot pain           -Patient seen and evaluated -X-rays reviewed -Patient is well healed -May continue with good supportive tennis shoes and may discontinue ankle brace -Encouraged patient to continue with scar creams and gentle ROM and since she is doing well she does not need physical therapy at this time -Patient to return as needed patient is discharged from postoperative care return if fails to continue to  improve.  Asencion Islam, DPM

## 2021-07-27 IMAGING — MR MR ANKLE*R* W/O CM
5 series · 36 of 40 positions shown · non-contrast
Comparison: 08/06/2006

CLINICAL DATA: Right lateral ankle pain on off since [REDACTED]

EXAM:
MRI OF THE RIGHT ANKLE WITHOUT CONTRAST
TECHNIQUE: Multiplanar, multisequence MR imaging of the ankle was performed. No
intravenous contrast was administered.

[Series 4: T2 fat-sat · axial · 3.0mm · 0.50mm/px · z∈[-89,+31]mm · 9 of 32 slices shown (1 of 2)]
[im 1/32]
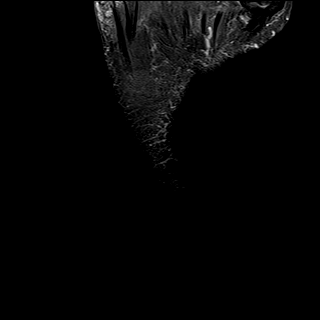
[im 4/32]
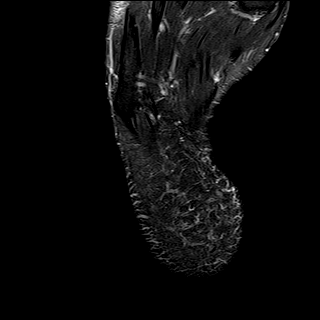
[im 8/32]
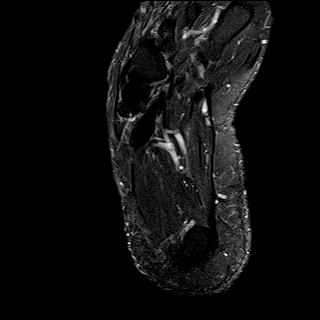
[im 12/32]
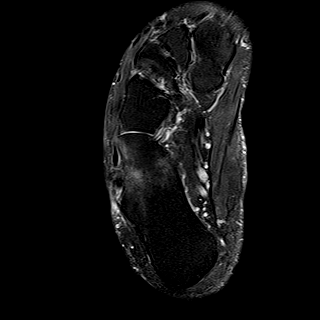
[im 16/32]
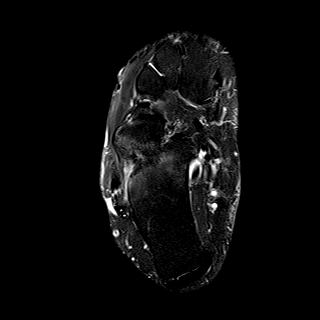
[im 20/32]
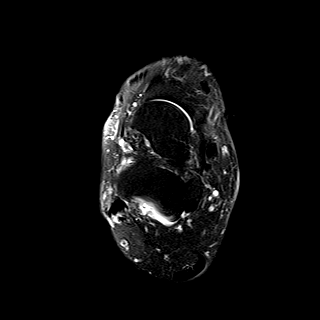
[im 24/32]
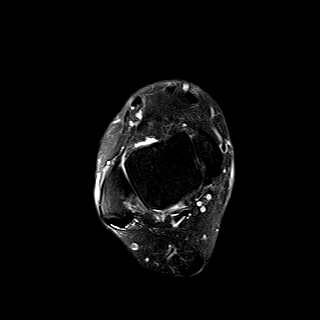
[im 28/32]
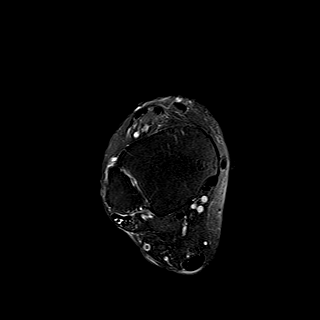
[im 32/32]
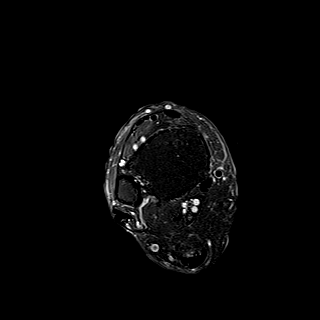

[Series 5: PD fat-sat · axial · 3.0mm · 0.42mm/px · z∈[-89,+31]mm · 9 of 32 slices shown]
[im 1/32]
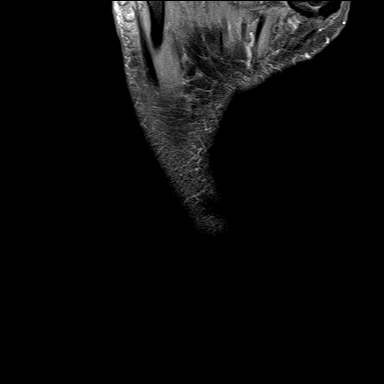
[im 4/32]
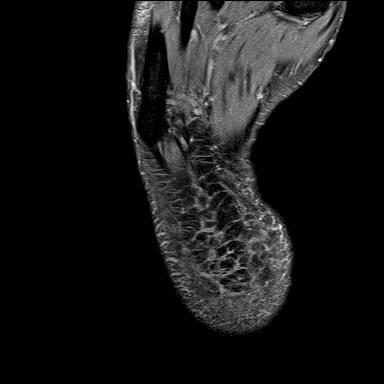
[im 8/32]
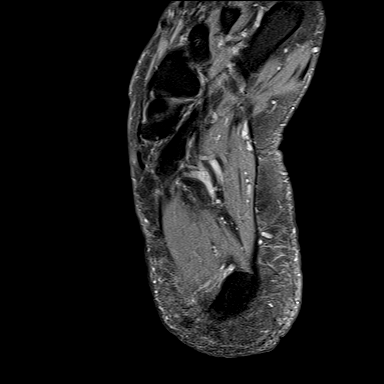
[im 12/32]
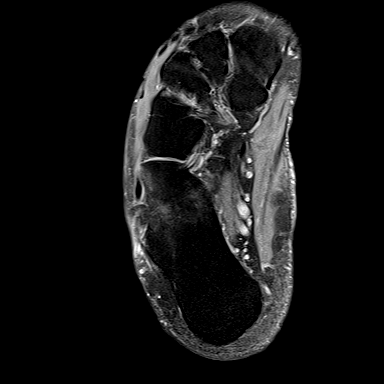
[im 16/32]
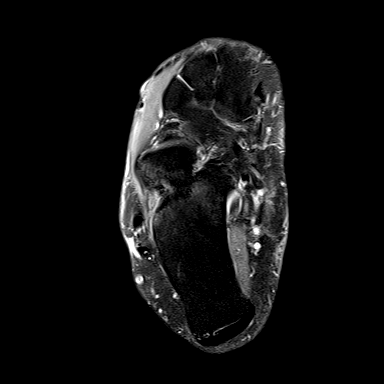
[im 20/32]
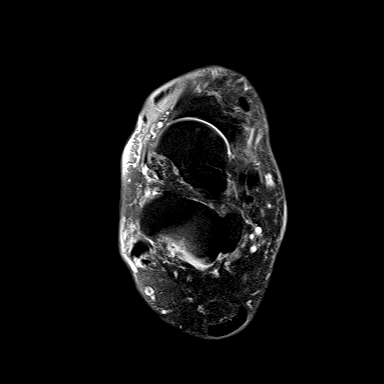
[im 24/32]
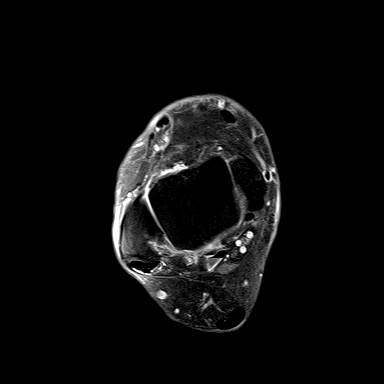
[im 28/32]
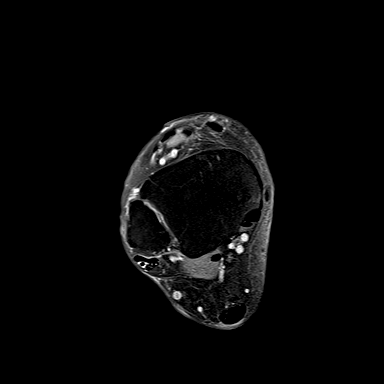
[im 32/32]
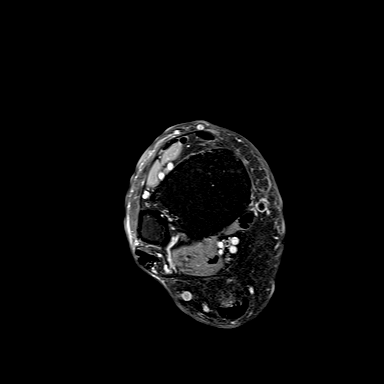

[Series 6: T1 · sagittal · 4.0mm · 0.56mm/px · 6 of 22 slices shown]
[im 1/22]
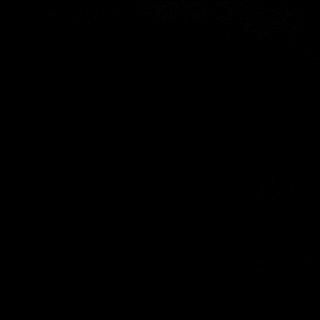
[im 5/22]
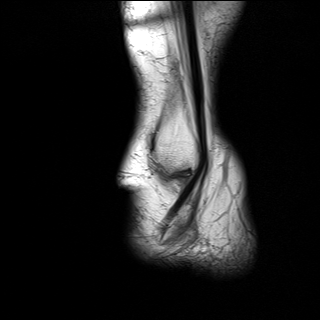
[im 9/22]
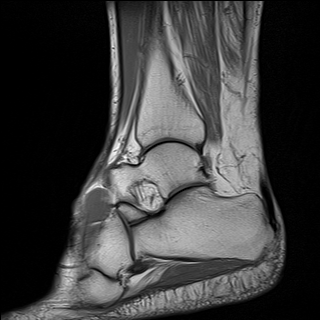
[im 13/22]
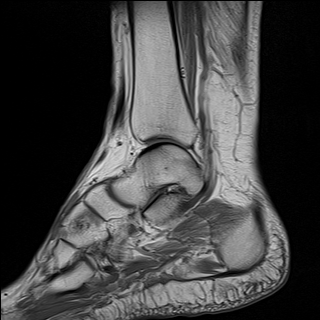
[im 17/22]
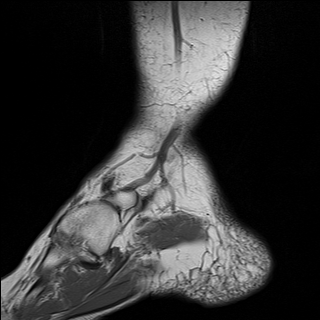
[im 22/22]
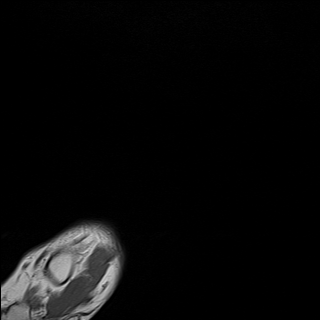

[Series 7: T2 fat-sat · coronal · 3.0mm · 0.50mm/px · 8 of 35 slices shown (2 of 2)]
[im 1/35]
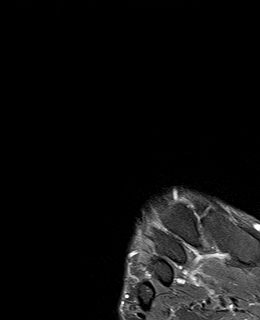
[im 4/35]
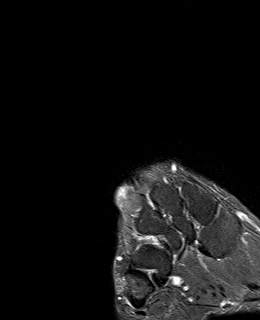
[im 12/35]
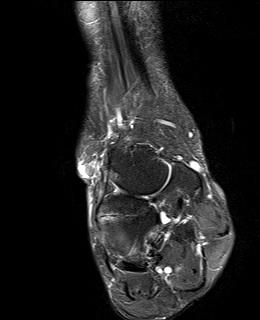
[im 16/35]
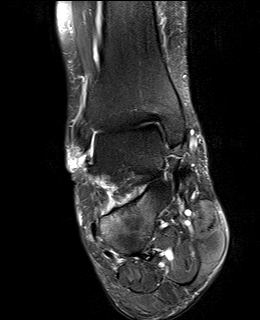
[im 19/35]
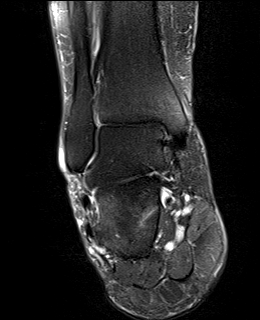
[im 23/35]
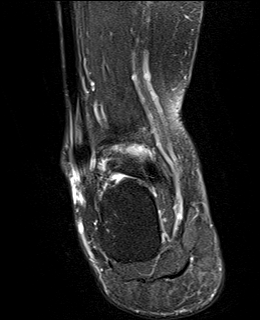
[im 31/35]
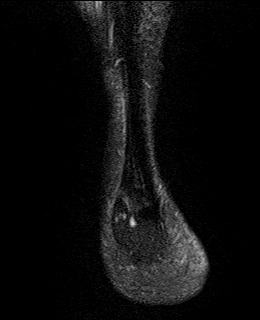
[im 35/35]
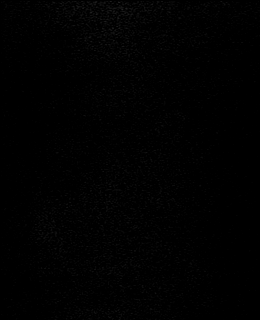

[Series 8: STIR · sagittal · 4.0mm · 0.35mm/px · 4 of 22 slices shown]
[im 1/22]
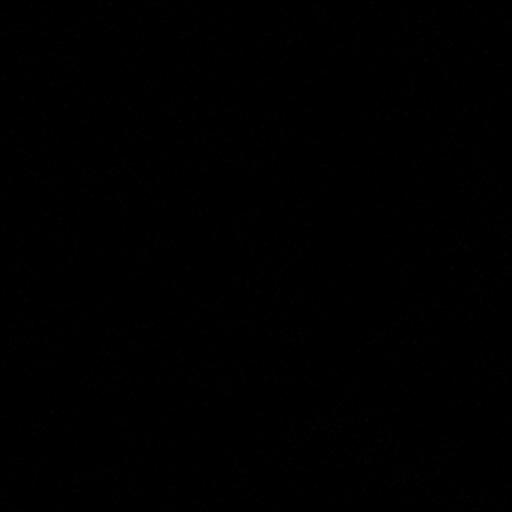
[im 5/22]
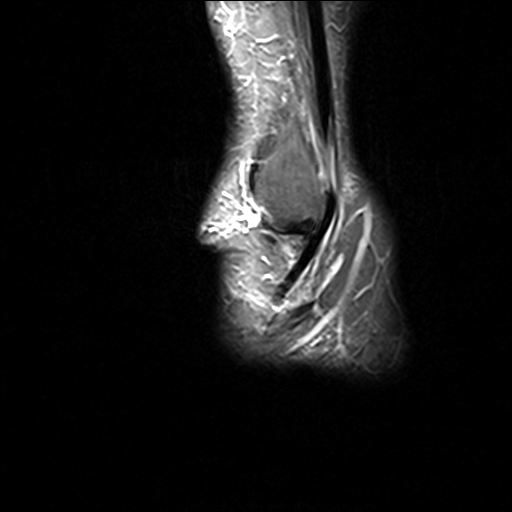
[im 9/22]
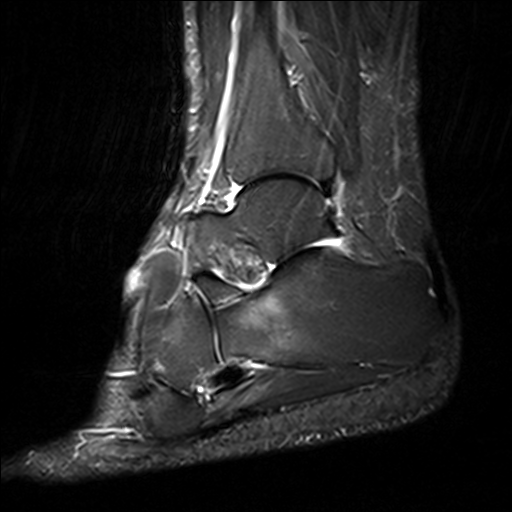
[im 13/22]
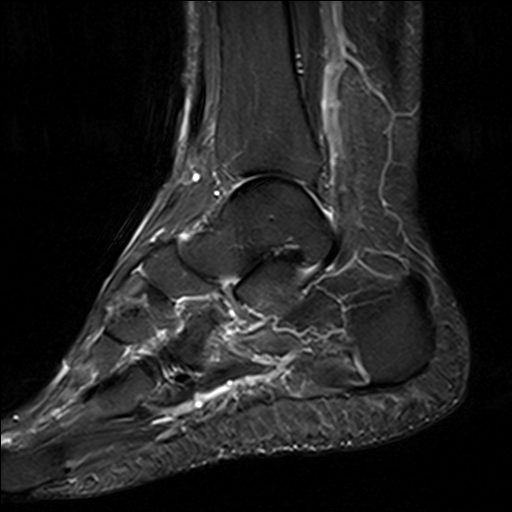

[36 of 40 positions shown; findings below may reference images not displayed]

FINDINGS: TENDONS

Peroneal: High-grade partial-thickness tear of the peroneus longus.
Peroneal brevis intact.

Posteromedial: Posterior tibial tendon intact. Flexor hallucis
longus tendon intact. Flexor digitorum longus tendon intact.

Anterior: Tibialis anterior tendon intact. Extensor hallucis longus
tendon intact Extensor digitorum longus tendon intact.

Achilles:  Intact.

Plantar Fascia: Intact.

LIGAMENTS

Lateral: Anterior talofibular ligament intact. Calcaneofibular
ligament intact. Posterior talofibular ligament intact. Anterior and
posterior tibiofibular ligaments intact.

Medial: Deltoid ligament intact. Spring ligament intact.

CARTILAGE

Ankle Joint: No joint effusion. Normal ankle mortise. No chondral
defect.

Subtalar Joints/Sinus Tarsi: Normal subtalar joints. No subtalar
joint effusion. Normal sinus tarsi.

Bones: Ununited subacute-chronic fracture of the anterior process of
the calcaneus with severe reactive marrow edema in the anterolateral
calcaneus which may be due to abnormal motion across the fracture
cleft. No other fracture or dislocation. No aggressive osseous
lesion.

Soft Tissue: No fluid collection or hematoma. Muscles are normal
without edema or atrophy. Tarsal tunnel is normal.
IMPRESSION: 1. High-grade partial-thickness tear of the peroneus longus.
2. Ununited subacute-chronic fracture of the anterior process of the
calcaneus with severe reactive marrow edema in the anterolateral
calcaneus which may be due to abnormal motion across the fracture
cleft.
